# Patient Record
Sex: Female | Born: 1965 | Race: White | Hispanic: No | Marital: Married | Smoking: Former smoker
Health system: Southern US, Community
[De-identification: ages and names within clinical notes are randomized; demographics above are authoritative.]

## PROBLEM LIST (undated history)

## (undated) DIAGNOSIS — M1990 Unspecified osteoarthritis, unspecified site: Secondary | ICD-10-CM

## (undated) DIAGNOSIS — D649 Anemia, unspecified: Secondary | ICD-10-CM

## (undated) HISTORY — PX: WISDOM TOOTH EXTRACTION: SHX21

## (undated) HISTORY — PX: TUBAL LIGATION: SHX77

## (undated) HISTORY — PX: DILATION AND CURETTAGE OF UTERUS: SHX78

---

## 2012-12-14 ENCOUNTER — Encounter (HOSPITAL_COMMUNITY): Payer: Self-pay | Admitting: Pharmacist

## 2012-12-19 ENCOUNTER — Encounter (HOSPITAL_COMMUNITY): Payer: Self-pay | Admitting: *Deleted

## 2012-12-25 NOTE — H&P (Signed)
Marie Erickson is an 47 y.o. female. She is admitted for hysteroscopy and NovaSure endometrial ablation.  Pertinent Gynecological History: Menses: flow is excessive with use of 7 pads or tampons on heaviest days Blood transfusions: none Sexually transmitted diseases: no past history OB History: Para 4, previous cesarean x 2.   Menstrual History: Patient's last menstrual period was 12/19/2012.    Past Medical History  Diagnosis Date  . Arthritis     lower back/hips - otc meds prn  . Anemia     Past Surgical History  Procedure Laterality Date  . Cesarean section      x 2  . Wisdom tooth extraction    . Dilation and curettage of uterus    . Tubal ligation      History reviewed. No pertinent family history.  Social History:  reports that she quit smoking about 30 years ago. She has never used smokeless tobacco. She reports that  drinks alcohol. She reports that she does not use illicit drugs.  Allergies: No Known Allergies  No prescriptions prior to admission    Review of Systems  Constitutional: Negative.   HENT: Negative.   Respiratory: Negative.   Cardiovascular: Negative.   Genitourinary: Negative.   Skin: Negative.     Last menstrual period 12/19/2012. Physical Exam  Constitutional: She appears well-developed.  HENT:  Head: Normocephalic.  Neck: No thyromegaly present.  Cardiovascular: Normal rate.   Respiratory: Effort normal.  GI: Soft.  Genitourinary: Vagina normal and uterus normal.  Musculoskeletal: Normal range of motion.  Lymphadenopathy:    She has no cervical adenopathy.  Neurological: She is alert.   Assessment/Plan: Menorrhagia/Novasure ablation  Mickel Baas 12/25/2012, 5:58 PM

## 2012-12-26 ENCOUNTER — Ambulatory Visit (HOSPITAL_COMMUNITY)
Admission: RE | Admit: 2012-12-26 | Discharge: 2012-12-26 | Disposition: A | Discharge status: Home / Self Care | Payer: BC Managed Care – PPO | Source: Ambulatory Visit | Attending: Obstetrics & Gynecology | Admitting: Obstetrics & Gynecology

## 2012-12-26 ENCOUNTER — Encounter (HOSPITAL_COMMUNITY): Payer: Self-pay | Admitting: Anesthesiology

## 2012-12-26 ENCOUNTER — Ambulatory Visit (HOSPITAL_COMMUNITY): Payer: BC Managed Care – PPO | Admitting: Anesthesiology

## 2012-12-26 ENCOUNTER — Encounter (HOSPITAL_COMMUNITY)
Admission: RE | Disposition: A | Discharge status: Home / Self Care | Payer: Self-pay | Source: Ambulatory Visit | Attending: Obstetrics & Gynecology

## 2012-12-26 DIAGNOSIS — N920 Excessive and frequent menstruation with regular cycle: Secondary | ICD-10-CM | POA: Insufficient documentation

## 2012-12-26 HISTORY — DX: Anemia, unspecified: D64.9

## 2012-12-26 HISTORY — PX: DILITATION & CURRETTAGE/HYSTROSCOPY WITH NOVASURE ABLATION: SHX5568

## 2012-12-26 HISTORY — DX: Unspecified osteoarthritis, unspecified site: M19.90

## 2012-12-26 HISTORY — PX: DILATION AND EVACUATION: SHX1459

## 2012-12-26 LAB — CBC
Hemoglobin: 10.6 g/dL — ABNORMAL LOW (ref 12.0–15.0)
MCH: 24.9 pg — ABNORMAL LOW (ref 26.0–34.0)
MCV: 79.3 fL (ref 78.0–100.0)
RBC: 4.26 MIL/uL (ref 3.87–5.11)

## 2012-12-26 SURGERY — DILATATION & CURETTAGE/HYSTEROSCOPY WITH NOVASURE ABLATION
Anesthesia: General | Site: Vagina | Wound class: Clean Contaminated

## 2012-12-26 MED ORDER — MIDAZOLAM HCL 5 MG/5ML IJ SOLN
INTRAMUSCULAR | Status: DC | PRN
  Administered 2012-12-26: 2 mg via INTRAVENOUS

## 2012-12-26 MED ORDER — LACTATED RINGERS IV SOLN
INTRAVENOUS | Status: DC
  Administered 2012-12-26: 06:00:00 via INTRAVENOUS

## 2012-12-26 MED ORDER — PROPOFOL 10 MG/ML IV EMUL
INTRAVENOUS | Status: AC
  Filled 2012-12-26: qty 20

## 2012-12-26 MED ORDER — FENTANYL CITRATE 0.05 MG/ML IJ SOLN
INTRAMUSCULAR | Status: AC
  Filled 2012-12-26: qty 2

## 2012-12-26 MED ORDER — KETOROLAC TROMETHAMINE 30 MG/ML IJ SOLN
INTRAMUSCULAR | Status: AC
  Filled 2012-12-26: qty 1

## 2012-12-26 MED ORDER — LIDOCAINE HCL 2 % IJ SOLN
INTRAMUSCULAR | Status: AC
  Filled 2012-12-26: qty 20

## 2012-12-26 MED ORDER — ONDANSETRON HCL 4 MG/2ML IJ SOLN
INTRAMUSCULAR | Status: AC
  Filled 2012-12-26: qty 2

## 2012-12-26 MED ORDER — LIDOCAINE HCL 2 % IJ SOLN
INTRAMUSCULAR | Status: DC | PRN
  Administered 2012-12-26: 10 mL

## 2012-12-26 MED ORDER — ONDANSETRON HCL 4 MG/2ML IJ SOLN
INTRAMUSCULAR | Status: DC | PRN
  Administered 2012-12-26: 4 mg via INTRAVENOUS

## 2012-12-26 MED ORDER — KETOROLAC TROMETHAMINE 30 MG/ML IJ SOLN
INTRAMUSCULAR | Status: DC | PRN
  Administered 2012-12-26: 30 mg via INTRAVENOUS

## 2012-12-26 MED ORDER — LIDOCAINE HCL (CARDIAC) 20 MG/ML IV SOLN
INTRAVENOUS | Status: AC
  Filled 2012-12-26: qty 5

## 2012-12-26 MED ORDER — PROPOFOL 10 MG/ML IV BOLUS
INTRAVENOUS | Status: DC | PRN
  Administered 2012-12-26: 200 mg via INTRAVENOUS
  Administered 2012-12-26: 100 mg via INTRAVENOUS

## 2012-12-26 MED ORDER — HYDROCODONE-ACETAMINOPHEN 5-500 MG PO TABS: 1.0000 | ORAL_TABLET | ORAL | Status: AC | PRN

## 2012-12-26 MED ORDER — MIDAZOLAM HCL 2 MG/2ML IJ SOLN
INTRAMUSCULAR | Status: AC
  Filled 2012-12-26: qty 2

## 2012-12-26 MED ORDER — LIDOCAINE HCL (CARDIAC) 20 MG/ML IV SOLN
INTRAVENOUS | Status: DC | PRN
  Administered 2012-12-26: 80 mg via INTRAVENOUS

## 2012-12-26 MED ORDER — 0.9 % SODIUM CHLORIDE (POUR BTL) OPTIME
TOPICAL | Status: DC | PRN
  Administered 2012-12-26: 1000 mL

## 2012-12-26 MED ORDER — DEXAMETHASONE SODIUM PHOSPHATE 10 MG/ML IJ SOLN
INTRAMUSCULAR | Status: AC
  Filled 2012-12-26: qty 1

## 2012-12-26 MED ORDER — LACTATED RINGERS IV SOLN
INTRAVENOUS | Status: DC | PRN
  Administered 2012-12-26: 3000 mL via INTRAUTERINE

## 2012-12-26 MED ORDER — DEXAMETHASONE SODIUM PHOSPHATE 4 MG/ML IJ SOLN
INTRAMUSCULAR | Status: DC | PRN
  Administered 2012-12-26: 10 mg via INTRAVENOUS

## 2012-12-26 MED ORDER — FENTANYL CITRATE 0.05 MG/ML IJ SOLN
INTRAMUSCULAR | Status: DC | PRN
  Administered 2012-12-26: 50 ug via INTRAVENOUS

## 2012-12-26 SURGICAL SUPPLY — 15 items
ABLATOR ENDOMETRIAL BIPOLAR (ABLATOR) ×2 IMPLANT
CATH ROBINSON RED A/P 16FR (CATHETERS) ×2 IMPLANT
CLOTH BEACON ORANGE TIMEOUT ST (SAFETY) ×2 IMPLANT
CONTAINER PREFILL 10% NBF 60ML (FORM) IMPLANT
DECANTER SPIKE VIAL GLASS SM (MISCELLANEOUS) ×2 IMPLANT
DRESSING TELFA 8X3 (GAUZE/BANDAGES/DRESSINGS) ×2 IMPLANT
GLOVE ECLIPSE 6.0 STRL STRAW (GLOVE) ×4 IMPLANT
GOWN STRL REIN XL XLG (GOWN DISPOSABLE) ×4 IMPLANT
PACK HYSTEROSCOPY LF (CUSTOM PROCEDURE TRAY) ×2 IMPLANT
PAD OB MATERNITY 4.3X12.25 (PERSONAL CARE ITEMS) ×2 IMPLANT
PAD PREP 24X48 CUFFED NSTRL (MISCELLANEOUS) ×2 IMPLANT
SET BERKELEY SUCTION TUBING (SUCTIONS) ×2 IMPLANT
TOWEL OR 17X24 6PK STRL BLUE (TOWEL DISPOSABLE) ×4 IMPLANT
VACURETTE 7MM CVD STRL WRAP (CANNULA) ×2 IMPLANT
WATER STERILE IRR 1000ML POUR (IV SOLUTION) ×2 IMPLANT

## 2012-12-26 NOTE — Transfer of Care (Signed)
Immediate Anesthesia Transfer of Care Note  Patient: Marie Erickson  Procedure(s) Performed: Procedure(s): DILATATION & CURETTAGE/HYSTEROSCOPY WITH NOVASURE ABLATION (N/A)  Patient Location: PACU  Anesthesia Type:General  Level of Consciousness: sedated and responds to stimulation  Airway & Oxygen Therapy: Patient Spontanous Breathing and Patient connected to nasal cannula oxygen  Post-op Assessment: Report given to PACU RN  Post vital signs: Reviewed and stable  Complications: No apparent anesthesia complications

## 2012-12-26 NOTE — Op Note (Signed)
Patient Name: Anis Cinelli MRN: [id number]  Date of Surgery: 12/26/2012    PREOPERATIVE DIAGNOSIS: MENORRHAGIA 40981  POSTOPERATIVE DIAGNOSIS: MENORRHAGIA   PROCEDURE: Hysteroscopy, D&C, Novasure endometrial ablation  SURGEON: Caralyn Guile. Arlyce Dice M.D.  ANESTHESIA: General, Paracervical block  ESTIMATED BLOOD LOSS: Minimal  FINDINGS: Cavity was 9 cm.,  Moderate tissue, no submucous myoma or endometrial polyps identified.   INDICATIONS: Menorrhagia   PROCEDURE IN DETAIL: The patient was taken to the OR and placed in the dors-lithotomy position. The perineum and vagina were prepped and draped in a sterile fashion. The bladder was emptied. Bimanual exam revealed a top normal sized retroverted uterus. The cervix and uterus were sounded and the cavity depth was determined to be 6.5 cm. The internal cervical os was dilated with Shawnie Pons dilators to 21 [place]. The diagnostic hysteroscope was introduced and the cavity was inspected. A sharp and suction curettage was performed and the tissue was sent to pathology. The internal os was dilated further to 25 [place] and the Novasure device was inserted and deployed to a width of 4.5 cm. Ablation time was 60 sec. At a power of 160. The hysteroscope was reintroduced and the cavity was intact and well cauterized. The procedure was terminated and the patient left the OR in good condition.

## 2012-12-26 NOTE — Anesthesia Procedure Notes (Signed)
Procedure Name: LMA Insertion Date/Time: 12/26/2012 7:17 AM Performed by: Maris Berger T Pre-anesthesia Checklist: Patient identified, Emergency Drugs available, Suction available and Patient being monitored Patient Re-evaluated:Patient Re-evaluated prior to inductionOxygen Delivery Method: Circle System Utilized Preoxygenation: Pre-oxygenation with 100% oxygen Intubation Type: IV induction Ventilation: Mask ventilation without difficulty LMA: LMA inserted LMA Size: 4.0 Number of attempts: 1 Placement Confirmation: positive ETCO2 Dental Injury: Teeth and Oropharynx as per pre-operative assessment  Comments: Gauze roll between teeth

## 2012-12-26 NOTE — Progress Notes (Signed)
I have interviewed and performed the pertinent exams on my patient to confirm that there have been no significant changes in her condition since the dictation of her history and physical exam.  

## 2012-12-26 NOTE — OR Nursing (Signed)
Late entry to add D&E to procedure.

## 2012-12-26 NOTE — Anesthesia Postprocedure Evaluation (Signed)
Anesthesia Post Note  Patient: Marie Erickson  Procedure(s) Performed: Procedure(s) (LRB): DILATATION & CURETTAGE/HYSTEROSCOPY WITH NOVASURE ABLATION (N/A)  Anesthesia type: General  Patient location: PACU  Post pain: Pain level controlled  Post assessment: Post-op Vital signs reviewed  Last Vitals:  Filed Vitals:   12/26/12 0619  BP: 116/62  Pulse: 64  Temp: 36.5 C  Resp: 18    Post vital signs: Reviewed  Level of consciousness: sedated  Complications: No apparent anesthesia complications

## 2012-12-26 NOTE — Anesthesia Preprocedure Evaluation (Signed)
Anesthesia Evaluation  Patient identified by MRN, date of birth, ID band Patient awake    Reviewed: Allergy & Precautions, H&P , NPO status , Patient's Chart, lab work & pertinent test results  Airway Mallampati: II TM Distance: >3 FB Neck ROM: full    Dental no notable dental hx. (+) Teeth Intact   Pulmonary neg pulmonary ROS,    Pulmonary exam normal       Cardiovascular negative cardio ROS      Neuro/Psych negative neurological ROS  negative psych ROS   GI/Hepatic negative GI ROS, Neg liver ROS,   Endo/Other  negative endocrine ROS  Renal/GU negative Renal ROS  negative genitourinary   Musculoskeletal negative musculoskeletal ROS (+)   Abdominal Normal abdominal exam  (+)   Peds negative pediatric ROS (+)  Hematology negative hematology ROS (+)   Anesthesia Other Findings   Reproductive/Obstetrics negative OB ROS                           Anesthesia Physical Anesthesia Plan  ASA: II  Anesthesia Plan: General   Post-op Pain Management:    Induction: Intravenous  Airway Management Planned: LMA  Additional Equipment:   Intra-op Plan:   Post-operative Plan:   Informed Consent: I have reviewed the patients History and Physical, chart, labs and discussed the procedure including the risks, benefits and alternatives for the proposed anesthesia with the patient or authorized representative who has indicated his/her understanding and acceptance.     Plan Discussed with: CRNA and Surgeon  Anesthesia Plan Comments:         Anesthesia Quick Evaluation

## 2012-12-27 ENCOUNTER — Encounter (HOSPITAL_COMMUNITY): Payer: Self-pay | Admitting: Obstetrics & Gynecology

## 2014-12-12 ENCOUNTER — Other Ambulatory Visit: Payer: Self-pay | Admitting: Gastroenterology

## 2016-02-15 DIAGNOSIS — L237 Allergic contact dermatitis due to plants, except food: Secondary | ICD-10-CM | POA: Diagnosis not present

## 2016-08-25 DIAGNOSIS — Z0001 Encounter for general adult medical examination with abnormal findings: Secondary | ICD-10-CM | POA: Diagnosis not present

## 2016-08-25 DIAGNOSIS — Z23 Encounter for immunization: Secondary | ICD-10-CM | POA: Diagnosis not present

## 2016-09-05 DIAGNOSIS — Z0001 Encounter for general adult medical examination with abnormal findings: Secondary | ICD-10-CM | POA: Diagnosis not present

## 2016-09-05 DIAGNOSIS — E669 Obesity, unspecified: Secondary | ICD-10-CM | POA: Diagnosis not present

## 2016-09-20 DIAGNOSIS — H903 Sensorineural hearing loss, bilateral: Secondary | ICD-10-CM | POA: Diagnosis not present

## 2017-01-08 DIAGNOSIS — J029 Acute pharyngitis, unspecified: Secondary | ICD-10-CM | POA: Diagnosis not present

## 2017-01-08 DIAGNOSIS — J0300 Acute streptococcal tonsillitis, unspecified: Secondary | ICD-10-CM | POA: Diagnosis not present

## 2017-01-08 DIAGNOSIS — J069 Acute upper respiratory infection, unspecified: Secondary | ICD-10-CM | POA: Diagnosis not present

## 2017-01-12 DIAGNOSIS — B309 Viral conjunctivitis, unspecified: Secondary | ICD-10-CM | POA: Diagnosis not present

## 2017-05-30 DIAGNOSIS — J029 Acute pharyngitis, unspecified: Secondary | ICD-10-CM | POA: Diagnosis not present

## 2018-01-24 DIAGNOSIS — Z6832 Body mass index (BMI) 32.0-32.9, adult: Secondary | ICD-10-CM | POA: Diagnosis not present

## 2018-01-24 DIAGNOSIS — Z01419 Encounter for gynecological examination (general) (routine) without abnormal findings: Secondary | ICD-10-CM | POA: Diagnosis not present

## 2018-01-24 DIAGNOSIS — Z1231 Encounter for screening mammogram for malignant neoplasm of breast: Secondary | ICD-10-CM | POA: Diagnosis not present

## 2018-01-24 DIAGNOSIS — Z124 Encounter for screening for malignant neoplasm of cervix: Secondary | ICD-10-CM | POA: Diagnosis not present

## 2018-06-25 ENCOUNTER — Encounter (HOSPITAL_COMMUNITY): Payer: Self-pay

## 2018-06-25 ENCOUNTER — Ambulatory Visit (INDEPENDENT_AMBULATORY_CARE_PROVIDER_SITE_OTHER): Payer: BLUE CROSS/BLUE SHIELD

## 2018-06-25 ENCOUNTER — Ambulatory Visit (HOSPITAL_COMMUNITY)
Admission: EM | Admit: 2018-06-25 | Discharge: 2018-06-25 | Disposition: A | Discharge status: Home / Self Care | Payer: BLUE CROSS/BLUE SHIELD | Attending: Family Medicine | Admitting: Family Medicine

## 2018-06-25 DIAGNOSIS — S93492A Sprain of other ligament of left ankle, initial encounter: Secondary | ICD-10-CM

## 2018-06-25 DIAGNOSIS — M25572 Pain in left ankle and joints of left foot: Secondary | ICD-10-CM

## 2018-06-25 DIAGNOSIS — S99922A Unspecified injury of left foot, initial encounter: Secondary | ICD-10-CM | POA: Diagnosis not present

## 2018-06-25 NOTE — ED Triage Notes (Signed)
Pt presents with swelling and pain left foot after injury a few days ago.

## 2018-06-25 NOTE — Discharge Instructions (Addendum)
Keep the shoe on for three weeks.  If you are not getting better please call Dr. Charlsie Merles.  I have provided his name and number.

## 2018-06-25 NOTE — ED Provider Notes (Signed)
06/25/2018 2:52 PM   DOB: [birth year] / MRN: [id number]  SUBJECTIVE:  Marie Erickson is a 52 y.o. female presenting for foot pain that started 3 days ago.  Assoicates twisting injury. Denies inability to ambulate.  Has tried a boot which helps. .   She has No Known Allergies.   She  has a past medical history of Anemia and Arthritis.    She  reports that she quit smoking about 35 years ago. She has a 0.50 pack-year smoking history. She has never used smokeless tobacco. She reports that she drinks alcohol. She reports that she does not use drugs. She  reports that she currently engages in sexual activity. She reports using the following method of birth control/protection: Surgical. The patient  has a past surgical history that includes Cesarean section; Wisdom tooth extraction; Dilation and curettage of uterus; Tubal ligation; Dilatation & currettage/hysteroscopy with novasure ablation (N/A, 12/26/2012); and Dilation and evacuation (N/A, 12/26/2012).  Her family history is not on file.  Review of Systems  Cardiovascular: Negative for leg swelling.  Musculoskeletal: Positive for joint pain. Negative for back pain, falls, myalgias and neck pain.  Skin: Negative for rash.    OBJECTIVE:  BP 137/78 (BP Location: Right Arm)   Pulse 75   Temp 98 F (36.7 C) (Oral)   Resp 20   LMP 06/01/2018   SpO2 100%   Wt Readings from Last 3 Encounters:  12/26/12 182 lb (82.6 kg)   Temp Readings from Last 3 Encounters:  06/25/18 98 F (36.7 C) (Oral)  12/26/12 98.2 F (36.8 C)   BP Readings from Last 3 Encounters:  06/25/18 137/78  12/26/12 (!) 115/58   Pulse Readings from Last 3 Encounters:  06/25/18 75  12/26/12 60    Physical Exam  Constitutional:  Non-toxic appearance.  Cardiovascular: Normal rate, regular rhythm, S1 normal, S2 normal, normal heart sounds and intact distal pulses. Exam reveals no gallop, no friction rub and no decreased pulses.  No murmur heard. Pulmonary/Chest: Effort normal.  No stridor. No respiratory distress. She has no wheezes. She has no rales.  Abdominal: She exhibits no distension.  Musculoskeletal: She exhibits tenderness (5th metatarsal without swelling and bruising). She exhibits no edema or deformity.       Left ankle: She exhibits normal range of motion, no swelling, no deformity and normal pulse. Tenderness. AITFL tenderness found. No lateral malleolus, no medial malleolus, no CF ligament, no posterior TFL, no head of 5th metatarsal and no proximal fibula tenderness found. Achilles tendon exhibits normal Thompson's test results.  Skin: Skin is warm and dry. She is not diaphoretic. No pallor.    No results found for this or any previous visit (from the past 72 hour(s)).  Dg Foot Complete Left  Result Date: 06/25/2018 CLINICAL DATA:  Tripping injury, fall EXAM: LEFT FOOT - COMPLETE 3+ VIEW COMPARISON:  None available FINDINGS: There is no evidence of fracture or dislocation. There is no evidence of arthropathy or other focal bone abnormality. Soft tissues are unremarkable. IMPRESSION: Negative. Electronically Signed   By: Judie Petit.  Shick M.D.   On: 06/25/2018 14:42    ASSESSMENT AND PLAN:   Pain of joint of left ankle and foot  Sprain of anterior talofibular ligament of left ankle, initial encounter    Discharge Instructions     Keep the shoe on for three weeks.  If you are not getting better please call Dr. Charlsie Merles.  I have provided his name and number.  The patient is advised to call or return to clinic if she does not see an improvement in symptoms, or to seek the care of the closest emergency department if she worsens with the above plan.   [name], MHS, PA-C 06/25/2018 2:52 PM   [name], PA-C 06/25/18 1452

## 2018-12-06 DIAGNOSIS — M2550 Pain in unspecified joint: Secondary | ICD-10-CM | POA: Diagnosis not present

## 2018-12-06 DIAGNOSIS — M549 Dorsalgia, unspecified: Secondary | ICD-10-CM | POA: Diagnosis not present

## 2018-12-06 DIAGNOSIS — M1990 Unspecified osteoarthritis, unspecified site: Secondary | ICD-10-CM | POA: Diagnosis not present

## 2019-03-25 DIAGNOSIS — M2550 Pain in unspecified joint: Secondary | ICD-10-CM | POA: Diagnosis not present

## 2019-03-25 DIAGNOSIS — M549 Dorsalgia, unspecified: Secondary | ICD-10-CM | POA: Diagnosis not present

## 2019-03-28 DIAGNOSIS — M79642 Pain in left hand: Secondary | ICD-10-CM | POA: Diagnosis not present

## 2019-03-28 DIAGNOSIS — M064 Inflammatory polyarthropathy: Secondary | ICD-10-CM | POA: Diagnosis not present

## 2019-03-28 DIAGNOSIS — M25562 Pain in left knee: Secondary | ICD-10-CM | POA: Diagnosis not present

## 2019-03-28 DIAGNOSIS — M2550 Pain in unspecified joint: Secondary | ICD-10-CM | POA: Diagnosis not present

## 2019-03-28 DIAGNOSIS — M25561 Pain in right knee: Secondary | ICD-10-CM | POA: Diagnosis not present

## 2019-03-28 DIAGNOSIS — M533 Sacrococcygeal disorders, not elsewhere classified: Secondary | ICD-10-CM | POA: Diagnosis not present

## 2019-03-28 DIAGNOSIS — M549 Dorsalgia, unspecified: Secondary | ICD-10-CM | POA: Diagnosis not present

## 2019-03-28 DIAGNOSIS — M79671 Pain in right foot: Secondary | ICD-10-CM | POA: Diagnosis not present

## 2019-03-28 DIAGNOSIS — M79641 Pain in right hand: Secondary | ICD-10-CM | POA: Diagnosis not present

## 2019-03-28 DIAGNOSIS — M069 Rheumatoid arthritis, unspecified: Secondary | ICD-10-CM | POA: Diagnosis not present

## 2019-03-28 DIAGNOSIS — M1990 Unspecified osteoarthritis, unspecified site: Secondary | ICD-10-CM | POA: Diagnosis not present

## 2019-04-11 DIAGNOSIS — M064 Inflammatory polyarthropathy: Secondary | ICD-10-CM | POA: Diagnosis not present

## 2019-04-11 DIAGNOSIS — M2550 Pain in unspecified joint: Secondary | ICD-10-CM | POA: Diagnosis not present

## 2019-04-11 DIAGNOSIS — M549 Dorsalgia, unspecified: Secondary | ICD-10-CM | POA: Diagnosis not present

## 2019-04-11 DIAGNOSIS — M1990 Unspecified osteoarthritis, unspecified site: Secondary | ICD-10-CM | POA: Diagnosis not present

## 2019-08-06 ENCOUNTER — Other Ambulatory Visit: Payer: Self-pay

## 2019-08-06 DIAGNOSIS — Z20822 Contact with and (suspected) exposure to covid-19: Secondary | ICD-10-CM

## 2019-08-06 DIAGNOSIS — Z20828 Contact with and (suspected) exposure to other viral communicable diseases: Secondary | ICD-10-CM | POA: Diagnosis not present

## 2019-08-08 LAB — NOVEL CORONAVIRUS, NAA: SARS-CoV-2, NAA: NOT DETECTED

## 2019-09-12 ENCOUNTER — Other Ambulatory Visit: Payer: Self-pay

## 2019-09-12 DIAGNOSIS — Z20822 Contact with and (suspected) exposure to covid-19: Secondary | ICD-10-CM

## 2019-09-14 LAB — NOVEL CORONAVIRUS, NAA: SARS-CoV-2, NAA: NOT DETECTED

## 2019-12-14 IMAGING — DX DG FOOT COMPLETE 3+V*L*
3 series · 3 of 3 positions shown · non-contrast
Comparison: None available

CLINICAL DATA: Tripping injury, fall

EXAM:
LEFT FOOT - COMPLETE 3+ VIEW

[foot ap]
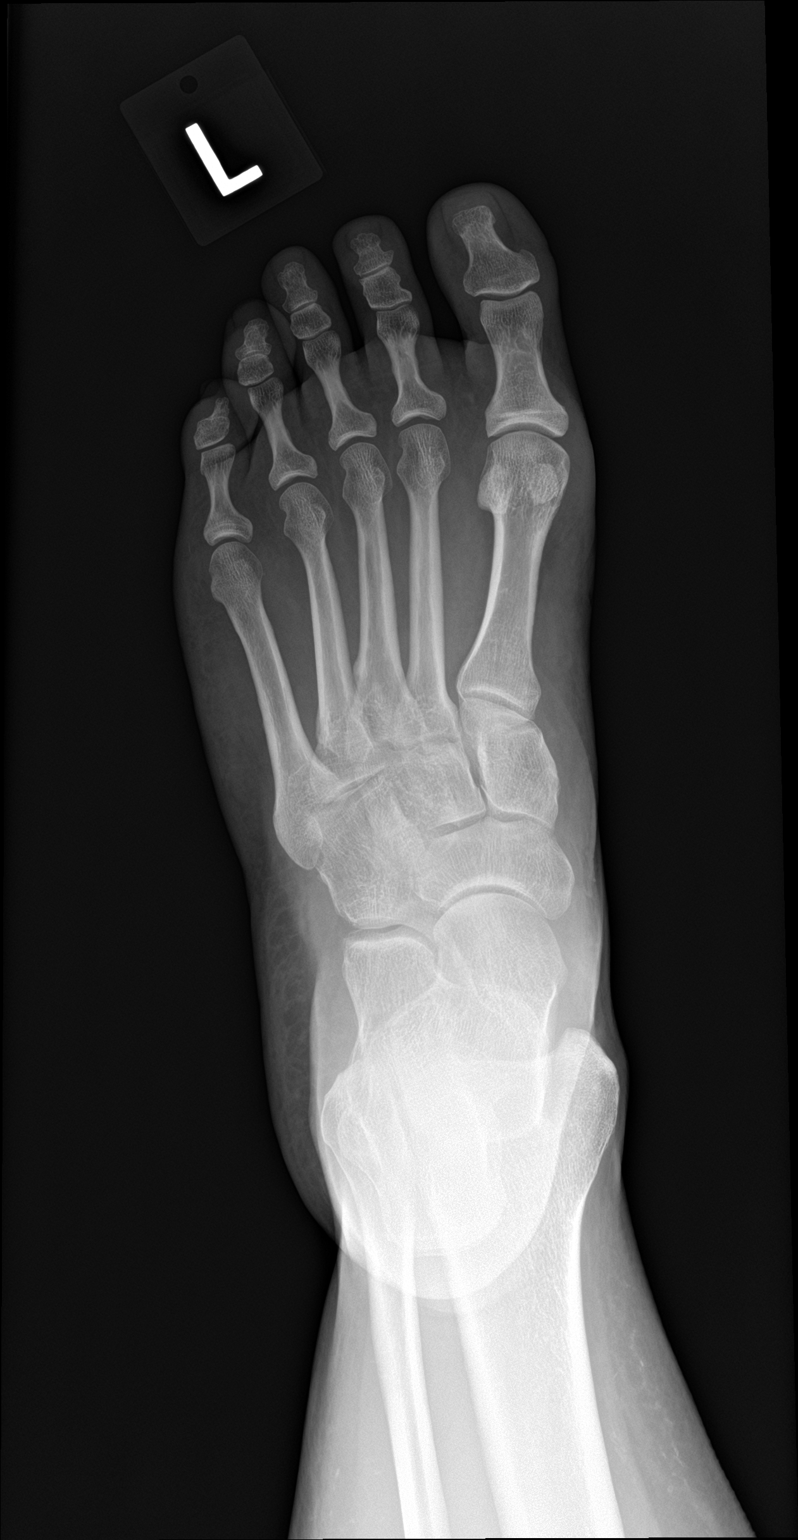

[foot obl]
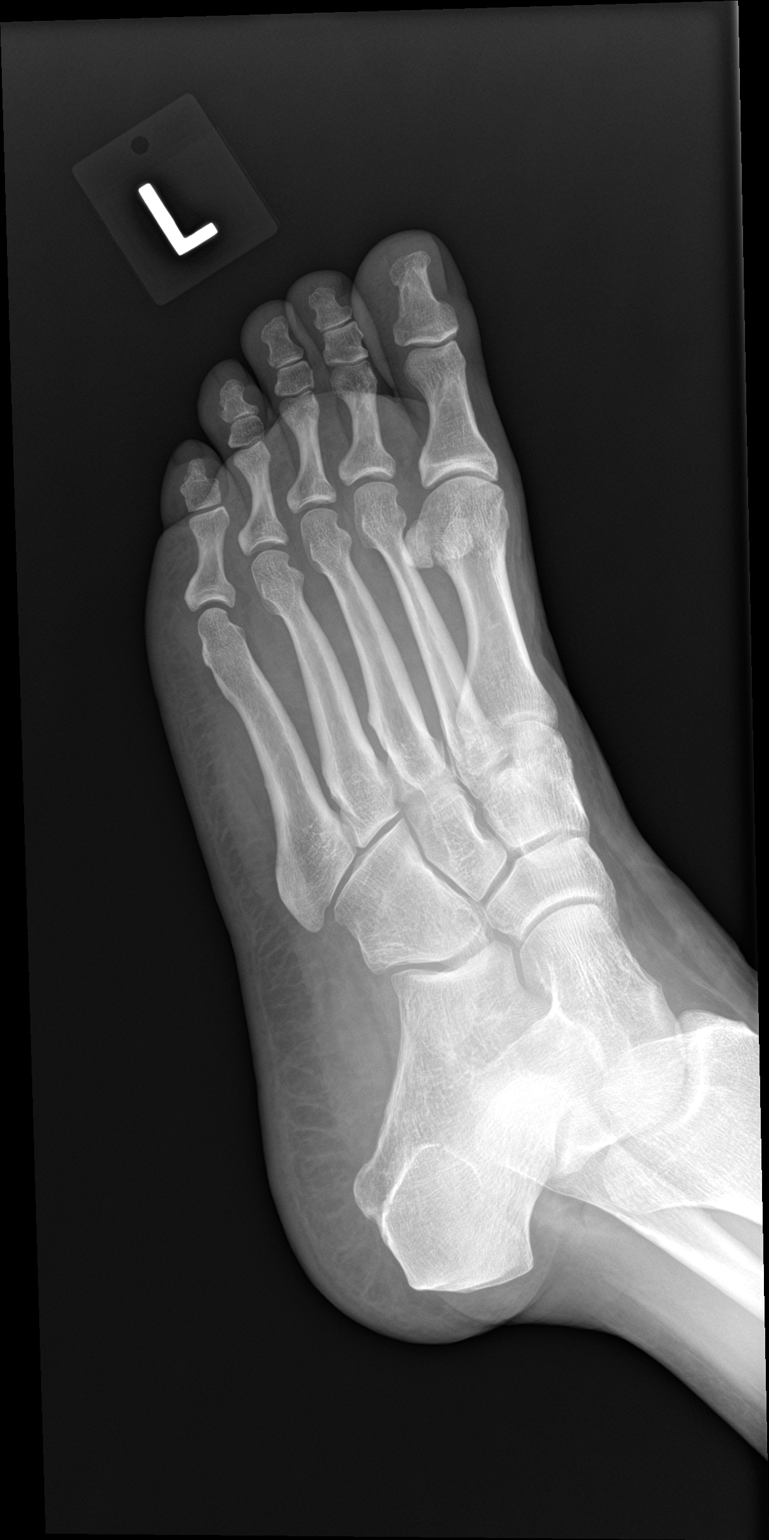

[foot lat]
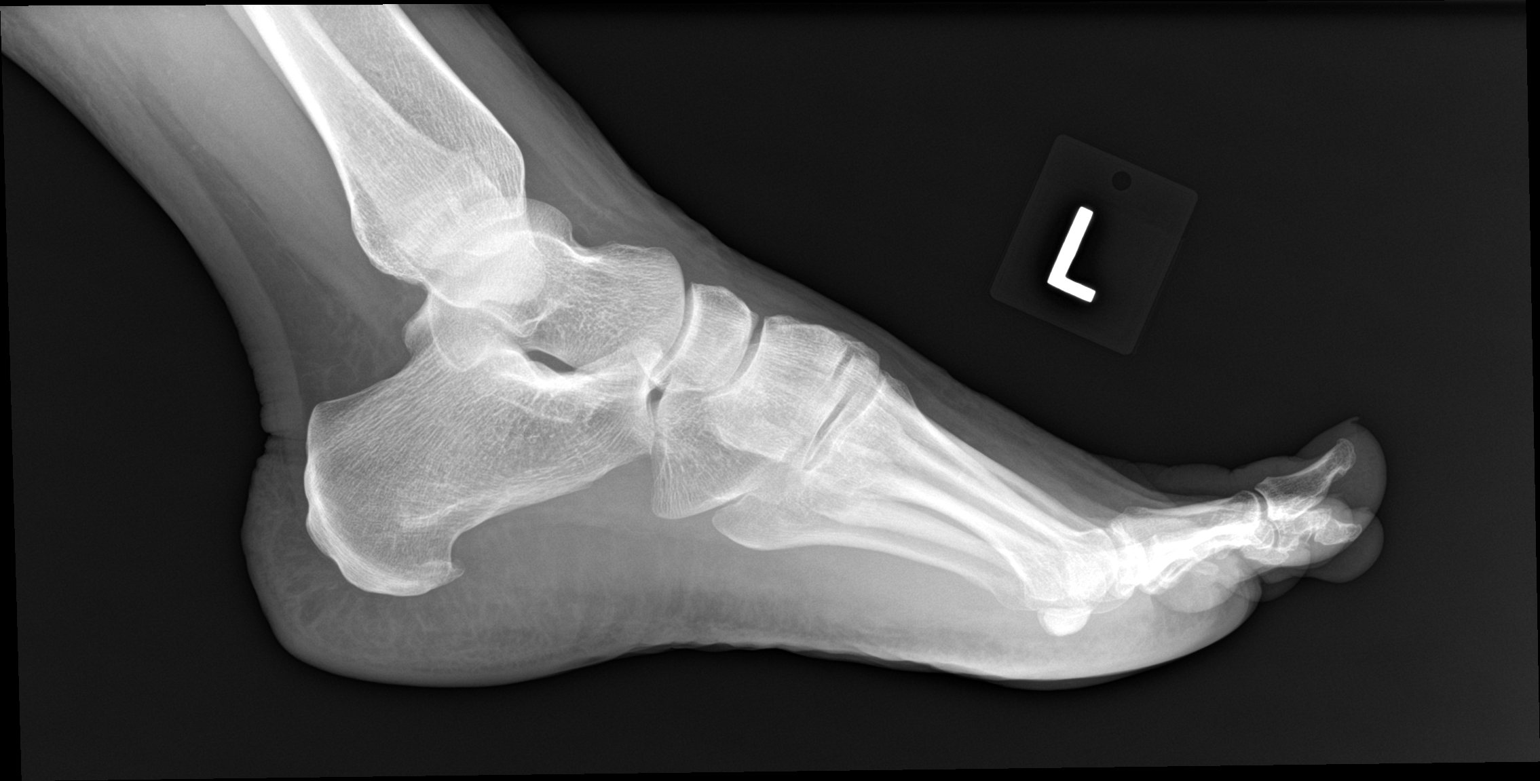

[3 of 3 positions shown; findings below may reference images not displayed]

FINDINGS: There is no evidence of fracture or dislocation. There is no
evidence of arthropathy or other focal bone abnormality. Soft
tissues are unremarkable.
IMPRESSION: Negative.

## 2020-11-24 DIAGNOSIS — H15102 Unspecified episcleritis, left eye: Secondary | ICD-10-CM | POA: Diagnosis not present

## 2021-01-13 DIAGNOSIS — K011 Impacted teeth: Secondary | ICD-10-CM | POA: Diagnosis not present

## 2022-09-01 ENCOUNTER — Other Ambulatory Visit (HOSPITAL_BASED_OUTPATIENT_CLINIC_OR_DEPARTMENT_OTHER): Payer: Self-pay

## 2022-09-01 MED ORDER — WEGOVY 0.25 MG/0.5ML ~~LOC~~ SOAJ
0.5000 mg | SUBCUTANEOUS | 0 refills | Status: AC
  Filled 2022-09-01: qty 2, 28d supply, fill #0

## 2022-09-20 ENCOUNTER — Other Ambulatory Visit (HOSPITAL_BASED_OUTPATIENT_CLINIC_OR_DEPARTMENT_OTHER): Payer: Self-pay

## 2022-09-22 ENCOUNTER — Other Ambulatory Visit (HOSPITAL_BASED_OUTPATIENT_CLINIC_OR_DEPARTMENT_OTHER): Payer: Self-pay

## 2022-09-22 ENCOUNTER — Encounter (HOSPITAL_BASED_OUTPATIENT_CLINIC_OR_DEPARTMENT_OTHER): Payer: Self-pay

## 2022-10-04 ENCOUNTER — Other Ambulatory Visit (HOSPITAL_BASED_OUTPATIENT_CLINIC_OR_DEPARTMENT_OTHER): Payer: Self-pay

## 2022-10-04 MED ORDER — WEGOVY 0.25 MG/0.5ML ~~LOC~~ SOAJ
SUBCUTANEOUS | 0 refills | Status: AC
  Filled 2022-10-04: qty 2, 28d supply, fill #0

## 2022-10-12 ENCOUNTER — Other Ambulatory Visit (HOSPITAL_BASED_OUTPATIENT_CLINIC_OR_DEPARTMENT_OTHER): Payer: Self-pay

## 2022-11-05 ENCOUNTER — Other Ambulatory Visit (HOSPITAL_BASED_OUTPATIENT_CLINIC_OR_DEPARTMENT_OTHER): Payer: Self-pay

## 2022-11-09 ENCOUNTER — Other Ambulatory Visit: Payer: Self-pay

## 2022-11-09 ENCOUNTER — Other Ambulatory Visit (HOSPITAL_BASED_OUTPATIENT_CLINIC_OR_DEPARTMENT_OTHER): Payer: Self-pay

## 2022-11-12 ENCOUNTER — Other Ambulatory Visit (HOSPITAL_BASED_OUTPATIENT_CLINIC_OR_DEPARTMENT_OTHER): Payer: Self-pay

## 2022-12-08 ENCOUNTER — Other Ambulatory Visit (HOSPITAL_BASED_OUTPATIENT_CLINIC_OR_DEPARTMENT_OTHER): Payer: Self-pay

## 2022-12-08 MED ORDER — WEGOVY 0.5 MG/0.5ML ~~LOC~~ SOAJ
0.5000 mg | SUBCUTANEOUS | 0 refills | Status: DC
  Filled 2022-12-08 – 2022-12-13 (×2): qty 2, 28d supply, fill #0

## 2022-12-09 ENCOUNTER — Other Ambulatory Visit (HOSPITAL_BASED_OUTPATIENT_CLINIC_OR_DEPARTMENT_OTHER): Payer: Self-pay

## 2022-12-11 ENCOUNTER — Other Ambulatory Visit (HOSPITAL_BASED_OUTPATIENT_CLINIC_OR_DEPARTMENT_OTHER): Payer: Self-pay

## 2022-12-12 ENCOUNTER — Other Ambulatory Visit (HOSPITAL_BASED_OUTPATIENT_CLINIC_OR_DEPARTMENT_OTHER): Payer: Self-pay

## 2022-12-12 ENCOUNTER — Other Ambulatory Visit: Payer: Self-pay

## 2022-12-13 ENCOUNTER — Other Ambulatory Visit (HOSPITAL_BASED_OUTPATIENT_CLINIC_OR_DEPARTMENT_OTHER): Payer: Self-pay

## 2022-12-19 ENCOUNTER — Other Ambulatory Visit (HOSPITAL_BASED_OUTPATIENT_CLINIC_OR_DEPARTMENT_OTHER): Payer: Self-pay

## 2022-12-19 MED ORDER — WEGOVY 0.5 MG/0.5ML ~~LOC~~ SOAJ
0.5000 mg | SUBCUTANEOUS | 0 refills | Status: AC
  Filled 2022-12-19: qty 2, 28d supply, fill #0

## 2023-01-09 ENCOUNTER — Other Ambulatory Visit (HOSPITAL_BASED_OUTPATIENT_CLINIC_OR_DEPARTMENT_OTHER): Payer: Self-pay

## 2023-01-10 ENCOUNTER — Other Ambulatory Visit (HOSPITAL_BASED_OUTPATIENT_CLINIC_OR_DEPARTMENT_OTHER): Payer: Self-pay

## 2023-01-10 MED ORDER — WEGOVY 1 MG/0.5ML ~~LOC~~ SOAJ
1.0000 mg | SUBCUTANEOUS | 0 refills | Status: DC
  Filled 2023-01-10: qty 2, 28d supply, fill #0

## 2023-02-03 ENCOUNTER — Other Ambulatory Visit (HOSPITAL_BASED_OUTPATIENT_CLINIC_OR_DEPARTMENT_OTHER): Payer: Self-pay

## 2023-02-03 MED ORDER — WEGOVY 1 MG/0.5ML ~~LOC~~ SOAJ
1.0000 mg | SUBCUTANEOUS | 0 refills | Status: AC
  Filled 2023-02-03: qty 2, 28d supply, fill #0

## 2023-03-08 ENCOUNTER — Other Ambulatory Visit (HOSPITAL_BASED_OUTPATIENT_CLINIC_OR_DEPARTMENT_OTHER): Payer: Self-pay

## 2023-03-08 MED ORDER — WEGOVY 1.7 MG/0.75ML ~~LOC~~ SOAJ
1.7000 mg | SUBCUTANEOUS | 0 refills | Status: DC
  Filled 2023-03-08: qty 3, 28d supply, fill #0

## 2023-04-04 ENCOUNTER — Other Ambulatory Visit (HOSPITAL_BASED_OUTPATIENT_CLINIC_OR_DEPARTMENT_OTHER): Payer: Self-pay

## 2023-04-04 MED ORDER — WEGOVY 1.7 MG/0.75ML ~~LOC~~ SOAJ
1.7000 mg | SUBCUTANEOUS | 0 refills | Status: DC
  Filled 2023-04-04: qty 3, 28d supply, fill #0

## 2023-04-04 MED ORDER — TRAZODONE HCL 50 MG PO TABS
50.0000 mg | ORAL_TABLET | Freq: Every evening | ORAL | 0 refills | Status: AC
  Filled 2023-04-04: qty 90, 90d supply, fill #0

## 2023-05-03 ENCOUNTER — Other Ambulatory Visit (HOSPITAL_BASED_OUTPATIENT_CLINIC_OR_DEPARTMENT_OTHER): Payer: Self-pay

## 2023-05-03 MED ORDER — WEGOVY 1.7 MG/0.75ML ~~LOC~~ SOAJ
1.7000 mg | SUBCUTANEOUS | 0 refills | Status: DC
  Filled 2023-05-03: qty 3, 28d supply, fill #0

## 2023-05-22 ENCOUNTER — Other Ambulatory Visit (HOSPITAL_BASED_OUTPATIENT_CLINIC_OR_DEPARTMENT_OTHER): Payer: Self-pay

## 2023-05-22 MED ORDER — WEGOVY 1.7 MG/0.75ML ~~LOC~~ SOAJ
1.7000 mg | SUBCUTANEOUS | 0 refills | Status: DC
  Filled 2023-05-22 – 2023-06-01 (×3): qty 3, 28d supply, fill #0

## 2023-05-26 ENCOUNTER — Other Ambulatory Visit (HOSPITAL_BASED_OUTPATIENT_CLINIC_OR_DEPARTMENT_OTHER): Payer: Self-pay

## 2023-06-01 ENCOUNTER — Other Ambulatory Visit (HOSPITAL_BASED_OUTPATIENT_CLINIC_OR_DEPARTMENT_OTHER): Payer: Self-pay

## 2023-06-29 ENCOUNTER — Other Ambulatory Visit (HOSPITAL_BASED_OUTPATIENT_CLINIC_OR_DEPARTMENT_OTHER): Payer: Self-pay

## 2023-06-29 MED ORDER — WEGOVY 2.4 MG/0.75ML ~~LOC~~ SOAJ
2.4000 mg | SUBCUTANEOUS | 0 refills | Status: DC
  Filled 2023-06-29: qty 3, 28d supply, fill #0

## 2023-07-01 ENCOUNTER — Other Ambulatory Visit (HOSPITAL_BASED_OUTPATIENT_CLINIC_OR_DEPARTMENT_OTHER): Payer: Self-pay

## 2023-07-27 ENCOUNTER — Other Ambulatory Visit (HOSPITAL_BASED_OUTPATIENT_CLINIC_OR_DEPARTMENT_OTHER): Payer: Self-pay

## 2023-07-27 MED ORDER — WEGOVY 2.4 MG/0.75ML ~~LOC~~ SOAJ
2.4000 mg | SUBCUTANEOUS | 0 refills | Status: DC
  Filled 2023-07-27: qty 3, 28d supply, fill #0

## 2023-08-15 ENCOUNTER — Other Ambulatory Visit (HOSPITAL_BASED_OUTPATIENT_CLINIC_OR_DEPARTMENT_OTHER): Payer: Self-pay

## 2023-08-15 MED ORDER — WEGOVY 2.4 MG/0.75ML ~~LOC~~ SOAJ
2.4000 mg | SUBCUTANEOUS | 0 refills | Status: DC
  Filled 2023-08-15 – 2023-08-24 (×2): qty 3, 28d supply, fill #0

## 2023-08-25 ENCOUNTER — Other Ambulatory Visit (HOSPITAL_BASED_OUTPATIENT_CLINIC_OR_DEPARTMENT_OTHER): Payer: Self-pay

## 2023-09-27 ENCOUNTER — Other Ambulatory Visit (HOSPITAL_BASED_OUTPATIENT_CLINIC_OR_DEPARTMENT_OTHER): Payer: Self-pay

## 2023-09-27 MED ORDER — WEGOVY 2.4 MG/0.75ML ~~LOC~~ SOAJ
2.4000 mg | SUBCUTANEOUS | 1 refills | Status: AC
  Filled 2023-09-27: qty 9, 84d supply, fill #0

## 2023-09-28 ENCOUNTER — Other Ambulatory Visit (HOSPITAL_BASED_OUTPATIENT_CLINIC_OR_DEPARTMENT_OTHER): Payer: Self-pay

## 2023-12-18 ENCOUNTER — Other Ambulatory Visit (HOSPITAL_BASED_OUTPATIENT_CLINIC_OR_DEPARTMENT_OTHER): Payer: Self-pay

## 2023-12-18 MED ORDER — WEGOVY 2.4 MG/0.75ML ~~LOC~~ SOAJ
2.4000 mg | SUBCUTANEOUS | 1 refills | Status: AC
  Filled 2023-12-18: qty 9, 84d supply, fill #0

## 2024-02-13 ENCOUNTER — Other Ambulatory Visit (HOSPITAL_BASED_OUTPATIENT_CLINIC_OR_DEPARTMENT_OTHER): Payer: Self-pay

## 2024-02-13 MED ORDER — WEGOVY 2.4 MG/0.75ML ~~LOC~~ SOAJ
2.4000 mg | SUBCUTANEOUS | 1 refills | Status: AC
  Filled 2024-02-13 – 2024-04-12 (×2): qty 9, 84d supply, fill #0

## 2024-03-08 ENCOUNTER — Other Ambulatory Visit (HOSPITAL_BASED_OUTPATIENT_CLINIC_OR_DEPARTMENT_OTHER): Payer: Self-pay

## 2024-03-08 MED ORDER — WEGOVY 2.4 MG/0.75ML ~~LOC~~ SOAJ
2.4000 mg | SUBCUTANEOUS | 1 refills | Status: DC
  Filled 2024-03-08 – 2024-03-11 (×2): qty 9, 84d supply, fill #0

## 2024-03-11 ENCOUNTER — Other Ambulatory Visit (HOSPITAL_BASED_OUTPATIENT_CLINIC_OR_DEPARTMENT_OTHER): Payer: Self-pay

## 2024-04-01 ENCOUNTER — Other Ambulatory Visit (HOSPITAL_BASED_OUTPATIENT_CLINIC_OR_DEPARTMENT_OTHER): Payer: Self-pay

## 2024-04-01 MED ORDER — WEGOVY 1.7 MG/0.75ML ~~LOC~~ SOAJ
1.7000 mg | SUBCUTANEOUS | 0 refills | Status: DC
  Filled 2024-04-01: qty 3, 28d supply, fill #0

## 2024-04-02 ENCOUNTER — Other Ambulatory Visit (HOSPITAL_BASED_OUTPATIENT_CLINIC_OR_DEPARTMENT_OTHER): Payer: Self-pay

## 2024-04-12 ENCOUNTER — Other Ambulatory Visit (HOSPITAL_BASED_OUTPATIENT_CLINIC_OR_DEPARTMENT_OTHER): Payer: Self-pay

## 2024-04-25 ENCOUNTER — Other Ambulatory Visit (HOSPITAL_BASED_OUTPATIENT_CLINIC_OR_DEPARTMENT_OTHER): Payer: Self-pay

## 2024-04-25 MED ORDER — WEGOVY 1.7 MG/0.75ML ~~LOC~~ SOAJ
1.7000 mg | SUBCUTANEOUS | 0 refills | Status: DC
  Filled 2024-04-25: qty 3, 28d supply, fill #0

## 2024-04-25 MED ORDER — TRAZODONE HCL 50 MG PO TABS: 50.0000 mg | ORAL_TABLET | Freq: Every day | ORAL | 0 refills | Status: AC

## 2024-04-25 MED ORDER — TRAZODONE HCL 50 MG PO TABS
50.0000 mg | ORAL_TABLET | Freq: Every day | ORAL | 0 refills | Status: AC
  Filled 2024-04-25: qty 90, 90d supply, fill #0

## 2024-05-21 ENCOUNTER — Other Ambulatory Visit (HOSPITAL_BASED_OUTPATIENT_CLINIC_OR_DEPARTMENT_OTHER): Payer: Self-pay

## 2024-05-22 ENCOUNTER — Other Ambulatory Visit (HOSPITAL_BASED_OUTPATIENT_CLINIC_OR_DEPARTMENT_OTHER): Payer: Self-pay

## 2024-05-22 MED ORDER — WEGOVY 1.7 MG/0.75ML ~~LOC~~ SOAJ
1.7000 mg | SUBCUTANEOUS | 0 refills | Status: DC
  Filled 2024-05-22: qty 3, 28d supply, fill #0

## 2024-05-23 ENCOUNTER — Other Ambulatory Visit: Payer: Self-pay

## 2024-05-27 ENCOUNTER — Other Ambulatory Visit: Payer: Self-pay

## 2024-06-26 ENCOUNTER — Other Ambulatory Visit (HOSPITAL_BASED_OUTPATIENT_CLINIC_OR_DEPARTMENT_OTHER): Payer: Self-pay

## 2024-06-26 MED ORDER — WEGOVY 1.7 MG/0.75ML ~~LOC~~ SOAJ
1.7000 mg | SUBCUTANEOUS | 1 refills | Status: AC
  Filled 2024-06-26 – 2024-07-12 (×2): qty 9, 84d supply, fill #0

## 2024-06-26 MED ORDER — WEGOVY 1.7 MG/0.75ML ~~LOC~~ SOAJ
1.7000 mg | SUBCUTANEOUS | 1 refills | Status: AC
  Filled 2024-06-26: qty 9, 84d supply, fill #0

## 2024-06-26 MED ORDER — TRAZODONE HCL 50 MG PO TABS: 50.0000 mg | ORAL_TABLET | Freq: Every day | ORAL | 0 refills | Status: DC

## 2024-07-08 ENCOUNTER — Other Ambulatory Visit (HOSPITAL_BASED_OUTPATIENT_CLINIC_OR_DEPARTMENT_OTHER): Payer: Self-pay

## 2024-07-12 ENCOUNTER — Other Ambulatory Visit (HOSPITAL_BASED_OUTPATIENT_CLINIC_OR_DEPARTMENT_OTHER): Payer: Self-pay

## 2024-07-30 ENCOUNTER — Other Ambulatory Visit (HOSPITAL_BASED_OUTPATIENT_CLINIC_OR_DEPARTMENT_OTHER): Payer: Self-pay

## 2024-07-30 MED ORDER — WEGOVY 1.7 MG/0.75ML ~~LOC~~ SOAJ
1.7000 mg | SUBCUTANEOUS | 1 refills | Status: AC
  Filled 2024-07-30 – 2024-08-19 (×4): qty 9, 84d supply, fill #0

## 2024-08-13 ENCOUNTER — Other Ambulatory Visit (HOSPITAL_BASED_OUTPATIENT_CLINIC_OR_DEPARTMENT_OTHER): Payer: Self-pay

## 2024-08-14 ENCOUNTER — Other Ambulatory Visit (HOSPITAL_BASED_OUTPATIENT_CLINIC_OR_DEPARTMENT_OTHER): Payer: Self-pay

## 2024-08-14 MED ORDER — WEGOVY 1.7 MG/0.75ML ~~LOC~~ SOAJ: 1.7000 mg | SUBCUTANEOUS | 1 refills | Status: AC

## 2024-08-15 ENCOUNTER — Encounter (HOSPITAL_BASED_OUTPATIENT_CLINIC_OR_DEPARTMENT_OTHER): Payer: Self-pay

## 2024-08-16 ENCOUNTER — Other Ambulatory Visit (HOSPITAL_BASED_OUTPATIENT_CLINIC_OR_DEPARTMENT_OTHER): Payer: Self-pay

## 2024-08-19 ENCOUNTER — Other Ambulatory Visit (HOSPITAL_BASED_OUTPATIENT_CLINIC_OR_DEPARTMENT_OTHER): Payer: Self-pay

## 2024-10-01 ENCOUNTER — Other Ambulatory Visit (HOSPITAL_BASED_OUTPATIENT_CLINIC_OR_DEPARTMENT_OTHER): Payer: Self-pay

## 2024-10-01 ENCOUNTER — Other Ambulatory Visit: Payer: Self-pay

## 2024-10-01 MED ORDER — WEGOVY 1.7 MG/0.75ML ~~LOC~~ SOAJ
1.7000 mg | SUBCUTANEOUS | 1 refills | Status: AC
  Filled 2024-10-01: qty 9, 84d supply, fill #0

## 2024-10-10 ENCOUNTER — Other Ambulatory Visit (HOSPITAL_BASED_OUTPATIENT_CLINIC_OR_DEPARTMENT_OTHER): Payer: Self-pay
